# Patient Record
Sex: Male | Born: 1975 | Race: White | Hispanic: No | Marital: Married
Health system: Midwestern US, Community
[De-identification: ages and names within clinical notes are randomized; demographics above are authoritative.]

## PROBLEM LIST (undated history)

## (undated) DIAGNOSIS — Z3009 Encounter for other general counseling and advice on contraception: Secondary | ICD-10-CM

## (undated) DIAGNOSIS — M1990 Unspecified osteoarthritis, unspecified site: Secondary | ICD-10-CM

## (undated) DIAGNOSIS — K219 Gastro-esophageal reflux disease without esophagitis: Secondary | ICD-10-CM

## (undated) HISTORY — DX: Gastro-esophageal reflux disease without esophagitis: K21.9

## (undated) HISTORY — PX: NASAL POLYP EXCISION: SHX2068

## (undated) HISTORY — DX: Unspecified osteoarthritis, unspecified site: M19.90

---

## 2014-03-27 ENCOUNTER — Emergency Department (HOSPITAL_COMMUNITY): Payer: BC Managed Care – PPO

## 2014-03-27 ENCOUNTER — Encounter (HOSPITAL_COMMUNITY): Payer: Self-pay | Admitting: Emergency Medicine

## 2014-03-27 ENCOUNTER — Emergency Department (HOSPITAL_COMMUNITY)
Admission: EM | Admit: 2014-03-27 | Discharge: 2014-03-27 | Disposition: A | Discharge status: Home / Self Care | Payer: BC Managed Care – PPO | Attending: Emergency Medicine | Admitting: Emergency Medicine

## 2014-03-27 DIAGNOSIS — R079 Chest pain, unspecified: Secondary | ICD-10-CM | POA: Insufficient documentation

## 2014-03-27 DIAGNOSIS — R109 Unspecified abdominal pain: Secondary | ICD-10-CM | POA: Insufficient documentation

## 2014-03-27 LAB — COMPREHENSIVE METABOLIC PANEL
ALT: 61 U/L — ABNORMAL HIGH (ref 0–53)
AST: 40 U/L — AB (ref 0–37)
Albumin: 3.5 g/dL (ref 3.5–5.2)
Alkaline Phosphatase: 59 U/L (ref 39–117)
Anion gap: 15 (ref 5–15)
BILIRUBIN TOTAL: 0.8 mg/dL (ref 0.3–1.2)
BUN: 12 mg/dL (ref 6–23)
CO2: 22 meq/L (ref 19–32)
CREATININE: 0.7 mg/dL (ref 0.50–1.35)
Calcium: 9 mg/dL (ref 8.4–10.5)
Chloride: 105 mEq/L (ref 96–112)
GFR calc non Af Amer: >90 mL/min (ref >90)
GLUCOSE: 116 mg/dL — AB (ref 70–99)
Potassium: 3.8 mEq/L (ref 3.7–5.3)
SODIUM: 142 meq/L (ref 137–147)
Total Protein: 7.4 g/dL (ref 6.0–8.3)

## 2014-03-27 LAB — CBC WITH DIFFERENTIAL/PLATELET
BASOS ABS: 0 10*3/uL (ref 0.0–0.1)
Basophils Relative: 1 % (ref 0–1)
Eosinophils Absolute: 0.1 10*3/uL (ref 0.0–0.7)
Eosinophils Relative: 2 % (ref 0–5)
HEMATOCRIT: 46.7 % (ref 39.0–52.0)
HEMOGLOBIN: 15.8 g/dL (ref 13.0–17.0)
LYMPHS ABS: 1.8 10*3/uL (ref 0.7–4.0)
LYMPHS PCT: 30 % (ref 12–46)
MCH: 31.2 pg (ref 26.0–34.0)
MCHC: 33.8 g/dL (ref 30.0–36.0)
MCV: 92.1 fL (ref 78.0–100.0)
MONO ABS: 0.3 10*3/uL (ref 0.1–1.0)
Monocytes Relative: 6 % (ref 3–12)
NEUTROS ABS: 3.7 10*3/uL (ref 1.7–7.7)
Neutrophils Relative %: 61 % (ref 43–77)
Platelets: 205 10*3/uL (ref 150–400)
RBC: 5.07 MIL/uL (ref 4.22–5.81)
RDW: 13.6 % (ref 11.5–15.5)
WBC: 6 10*3/uL (ref 4.0–10.5)

## 2014-03-27 LAB — URINALYSIS, ROUTINE W REFLEX MICROSCOPIC
Bilirubin Urine: NEGATIVE
GLUCOSE, UA: NEGATIVE mg/dL
Hgb urine dipstick: NEGATIVE
Ketones, ur: NEGATIVE mg/dL
Leukocytes, UA: NEGATIVE
Nitrite: NEGATIVE
PH: 7.5 (ref 5.0–8.0)
Protein, ur: NEGATIVE mg/dL
Specific Gravity, Urine: 1.021 (ref 1.005–1.030)
Urobilinogen, UA: 1 mg/dL (ref 0.0–1.0)

## 2014-03-27 LAB — I-STAT TROPONIN, ED
TROPONIN I, POC: 0 ng/mL (ref 0.00–0.08)
Troponin i, poc: 0 ng/mL (ref 0.00–0.08)

## 2014-03-27 LAB — LIPASE, BLOOD: Lipase: 30 U/L (ref 11–59)

## 2014-03-27 MED ORDER — ASPIRIN 81 MG PO CHEW
324.0000 mg | CHEWABLE_TABLET | Freq: Once | ORAL | Status: AC
  Administered 2014-03-27: 324 mg via ORAL
  Filled 2014-03-27: qty 4

## 2014-03-27 MED ORDER — IOHEXOL 300 MG/ML  SOLN
80.0000 mL | Freq: Once | INTRAMUSCULAR | Status: AC | PRN
  Administered 2014-03-27: 80 mL via INTRAVENOUS

## 2014-03-27 MED ORDER — IOHEXOL 300 MG/ML  SOLN
25.0000 mL | Freq: Once | INTRAMUSCULAR | Status: AC | PRN
  Administered 2014-03-27: 25 mL via ORAL

## 2014-03-27 NOTE — ED Notes (Signed)
CT informed pt drank contrast

## 2014-03-27 NOTE — ED Notes (Signed)
Pt c/o lower abdominal pain x 1 week.  Reports pain worse yesterday; tender on palpation in LLQ. Denies urinary issues. Also complaining of L sided chest pain started "some yesterday." Reports dull and sharp pain at times. Pain radiates into neck, shoulder blades, and down L arm.

## 2014-03-27 NOTE — ED Notes (Signed)
Resident at bedside.  

## 2014-03-27 NOTE — ED Provider Notes (Signed)
I saw and evaluated the patient, reviewed the resident's note and I agree with the findings and plan.   EKG Interpretation   Date/Time:  Monday March 27 2014 08:49:32 EDT Ventricular Rate:  80 PR Interval:  154 QRS Duration: 96 QT Interval:  366 QTC Calculation: 422 R Axis:   84 Text Interpretation:  Normal sinus rhythm with sinus arrhythmia Normal ECG  Confirmed by Rosita Guzzetta,  DO, Gelene Recktenwald ([id number]) on 03/27/2014 9:16:04 AM      Pt is a 38 y.o. M with no significant past medical history who presents the emergency department with a week of lower abdominal pain, worse in the left lower quadrant without nausea, vomiting, diarrhea, dysuria or hematuria, testicular pain or swelling, bloody stools or melena. 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 He states that last night he began having some left sided chest pressure that radiates into his neck with shortness of breath. No diaphoresis or dizziness. He has no history of hypertension, diabetes, hyperlipidemia or tobacco use. No family history of CAD. No risk factors for pulmonary embolus. On exam, patient appears comfortable, hemodynamically stable. He is diffusely tender to palpation across his lower abdomen, worse in left lower quadrant. His GU exam is normal. There is no testicular pain or swelling. No scrotal masses. No penile discharge or lesions. Doubt dissection. Will obtain cardiac labs, abdominal labs, CT of his abdomen and pelvis.    Patient's labs are unremarkable including 2 troponins. He does have mild elevation of his AST and ALT but has actually no right upper quadrant tenderness or epigastric pain on exam. CT shows no acute abnormality. I feel he is safe to be discharged home with outpatient followup.  Devens, DO 03/27/14 1243

## 2014-03-27 NOTE — ED Notes (Signed)
Dr.Ward aware of patient's pain; pt attempting to use restroom at this time

## 2014-03-27 NOTE — ED Notes (Signed)
Per pt sts last night he started having some abdominal pain. sts this am began having chest pain, left arm pain, jaw pain and numbness and tingling. Denies N,V,D.

## 2014-03-27 NOTE — Discharge Instructions (Signed)

## 2014-03-27 NOTE — ED Notes (Signed)
MD at bedside. 

## 2014-03-27 NOTE — ED Provider Notes (Signed)
CSN: [id number]     Arrival date & time 03/27/14  0845 History   First MD Initiated Contact with Patient 03/27/14 0901     Chief Complaint  Patient presents with  . Chest Pain     (Consider location/radiation/quality/duration/timing/severity/associated sxs/prior Treatment) Patient is a 38 y.o. male presenting with chest pain and abdominal pain.  Chest Pain Pain location:  Substernal area Pain quality: pressure   Pain radiates to:  L arm and L jaw Pain radiates to the back: no   Pain severity:  Moderate Onset quality:  Sudden Duration:  2 days Progression:  Unchanged Chronicity:  New Associated symptoms: abdominal pain   Associated symptoms: no back pain, no cough, no fever, no headache, no nausea, no shortness of breath and not vomiting   Abdominal Pain Pain location:  RLQ and RUQ Pain quality: cramping   Pain severity:  Severe Onset quality:  Gradual Duration:  7 days Progression:  Worsening Chronicity:  New Associated symptoms: chest pain   Associated symptoms: no chills, no cough, no dysuria, no fever, no nausea, no shortness of breath, no sore throat and no vomiting     History reviewed. No pertinent past medical history. History reviewed. No pertinent past surgical history. History reviewed. No pertinent family history. History  Substance Use Topics  . Smoking status: Never Smoker   . Smokeless tobacco: Not on file  . Alcohol Use: No    Review of Systems  Constitutional: Negative for fever and chills.  HENT: Negative for sore throat.   Eyes: Negative for pain.  Respiratory: Negative for cough and shortness of breath.   Cardiovascular: Positive for chest pain.  Gastrointestinal: Positive for abdominal pain. Negative for nausea and vomiting.  Genitourinary: Negative for dysuria and flank pain.  Musculoskeletal: Negative for back pain and neck pain.  Skin: Negative for rash.  Neurological: Negative for seizures and headaches.      Allergies  Review of  patient's allergies indicates no known allergies.  Home Medications   Prior to Admission medications   Not on File   BP 113/80  Pulse 71  Temp(Src) 98.1 F (36.7 C) (Oral)  Resp 20  Ht 6' 3"  (1.905 m)  Wt 315 lb (142.883 kg)  BMI 39.37 kg/m2  SpO2 99% Physical Exam  Constitutional: He is oriented to person, place, and time. He appears well-developed and well-nourished. No distress.  HENT:  Head: Normocephalic and atraumatic.  Eyes: Pupils are equal, round, and reactive to light.  Neck: Normal range of motion.  Cardiovascular: Normal rate and regular rhythm.   Pulmonary/Chest: Effort normal and breath sounds normal.  Abdominal: Soft. He exhibits no distension. There is tenderness. There is no rigidity, no rebound, no guarding, no tenderness at McBurney's point and negative Murphy's sign.  Musculoskeletal: Normal range of motion.  Neurological: He is alert and oriented to person, place, and time.  Skin: Skin is warm. He is not diaphoretic.    ED Course  Procedures (including critical care time) Labs Review Labs Reviewed - No data to display  Imaging Review No results found.   EKG Interpretation None      MDM   Final diagnoses:  None   37 year old male with no significant past medical history presents today with abdominal pain and chest pain radiating into his left arm and left jaw. Patient also states he has had some jaw pain and numbness and tingling. Patient does deny any nausea vomiting or diarrhea.  On arrival the patient is in no acute  distress. His vital signs are stable. Patient appears uncomfortable. On exam the patient is a normal cardiopulmonary exam and minimal tenderness in the lower quadrants of the abdomen. Otherwise his abdomen is nondistended. He does not appear to have an acute abdomen at this time. Plan for this patient is to obtain some basic labs and to obtain a CT scan of his abdomen and pelvis.  Given the patient's chest pain 2 troponin levels  were checked and were both negative. Urinalysis was negative as was a CBC CMP and a lipase level. CT abdomen and pelvis with contrast demonstrated no intra-abdominal abnormalities to explain the patient's abdominal pain this time.  Patient was reassessed and had minimal improvement of his abdominal pain. I discussed with the patient at length the normal findings on his labs. Discussed importance of establishing with him following up with the primary care physician. Patient bedtime asked if he could speak with a psychiatrist on: Hospital. I asked the patient to specify what he needed to speak to a psychiatrist now. The patient stated they did not want to talk about it to me at this time. I asked the patient if he had any thoughts of wanting to harm himself or others. Patient denied any homicidal or suicidal thoughts. I discussed with the patient the possibility of setting him up with psychiatry as an outpatient. The patient was amenable to this plan. Patient at this time is comfortable with discharge and will followup with a Primary Care physician. Patient was discharged home in stable condition. The patient was seen and evaluated by myself and by the attending Dr. Glennon Hamilton, MD 03/27/14 [phone]

## 2014-04-18 ENCOUNTER — Ambulatory Visit (INDEPENDENT_AMBULATORY_CARE_PROVIDER_SITE_OTHER): Payer: BC Managed Care – PPO | Admitting: Family

## 2014-04-18 ENCOUNTER — Encounter: Payer: Self-pay | Admitting: Family

## 2014-04-18 ENCOUNTER — Ambulatory Visit: Payer: BC Managed Care – PPO | Admitting: Family

## 2014-04-18 VITALS — BP 106/74 | HR 74 | Ht 75.0 in | Wt 315.0 lb

## 2014-04-18 DIAGNOSIS — K449 Diaphragmatic hernia without obstruction or gangrene: Secondary | ICD-10-CM

## 2014-04-18 NOTE — Progress Notes (Signed)
Pre visit review using our clinic review tool, if applicable. No additional management support is needed unless otherwise documented below in the visit note. 

## 2014-04-18 NOTE — Patient Instructions (Signed)
Hiatal Hernia A hiatal hernia occurs when part of your stomach slides above the muscle that separates your abdomen from your chest (diaphragm). You can be born with a hiatal hernia (congenital), or it may develop over time. In almost all cases of hiatal hernia, only the top part of the stomach pushes through.  Many people have a hiatal hernia with no symptoms. The larger the hernia, the more likely that you will have symptoms. In some cases, a hiatal hernia allows stomach acid to flow back into the tube that carries food from your mouth to your stomach (esophagus). This may cause heartburn symptoms. Severe heartburn symptoms may mean you have developed a condition called gastroesophageal reflux disease (GERD).  CAUSES  Hiatal hernias are caused by a weakness in the opening (hiatus) where your esophagus passes through your diaphragm to attach to the upper part of your stomach. You may be born with a weakness in your hiatus, or a weakness can develop. RISK FACTORS Older age is a major risk factor for a hiatal hernia. Anything that increases pressure on your diaphragm can also increase your risk of a hiatal hernia. This includes:  Pregnancy.  Excess weight.  Frequent constipation. SIGNS AND SYMPTOMS  People with a hiatal hernia often have no symptoms. If symptoms develop, they are almost always caused by GERD. They may include:  Heartburn.  Belching.  Indigestion.  Trouble swallowing.  Coughing or wheezing.  Sore throat.  Hoarseness.  Chest pain. DIAGNOSIS  A hiatal hernia is sometimes found during an exam for another problem. Your health care provider may suspect a hiatal hernia if you have symptoms of GERD. Tests may be done to diagnose GERD. These may include:  X-rays of your stomach or chest.  An upper gastrointestinal (GI) series. This is an X-ray exam of your GI tract involving the use of a chalky liquid that you swallow. The liquid shows up clearly on the X-ray.  Endoscopy.  This is a procedure to look into your stomach using a thin, flexible tube that has a tiny camera and light on the end of it. TREATMENT  If you have no symptoms, you may not need treatment. If you have symptoms, treatment may include:  Dietary and lifestyle changes to help reduce GERD symptoms.  Medicines. These may include:  Over-the-counter antacids.  Medicines that make your stomach empty more quickly.  Medicines that block the production of stomach acid (H2 blockers).  Stronger medicines to reduce stomach acid (proton pump inhibitors).  You may need surgery to repair the hernia if other treatments are not helping. HOME CARE INSTRUCTIONS   Take all medicines as directed by your health care provider.  Quit smoking, if you smoke.  Try to achieve and maintain a healthy body weight.  Eat frequent small meals instead of three large meals a day. This keeps your stomach from getting too full.  Eat slowly.  Do not lie down right after eating.  Do noteat 1-2 hours before bed.   Do not drink beverages with caffeine. These include cola, coffee, cocoa, and tea.  Do not drink alcohol.  Avoid foods that can make symptoms of GERD worse. These may include:  Fatty foods.  Citrus fruits.  Other foods and drinks that contain acid.  Avoid putting pressure on your belly. Anything that puts pressure on your belly increases the amount of acid that may be pushed up into your esophagus.   Avoid bending over, especially after eating.  Raise the head of your bed  by putting blocks under the legs. This keeps your head and esophagus higher than your stomach.  Do not wear tight clothing around your chest or stomach.  Try not to strain when having a bowel movement, when urinating, or when lifting heavy objects. SEEK MEDICAL CARE IF:  Your symptoms are not controlled with medicines or lifestyle changes.  You are having trouble swallowing.  You have coughing or wheezing that will not  go away. SEEK IMMEDIATE MEDICAL CARE IF:  Your pain is getting worse.  Your pain spreads to your arms, neck, jaw, teeth, or back.  You have shortness of breath.  You sweat for no reason.  You feel sick to your stomach (nauseous) or vomit.  You vomit blood.  You have bright red blood in your stools.  You have black, tarry stools.  Document Released: 11/29/2003 Document Revised: 01/23/2014 Document Reviewed: 08/26/2013 [facility] Patient Information 2015 [place], [place]. This information is not intended to replace advice given to you by your health care provider. Make sure you discuss any questions you have with your health care provider.

## 2014-04-19 ENCOUNTER — Encounter: Payer: Self-pay | Admitting: Family

## 2014-04-19 NOTE — Progress Notes (Signed)
   Subjective:    Patient ID: Franklin Wade, male    DOB: 08/05/1976, 38 y.o.   MRN: [id number]  HPI 38 year old male, nonsmoker patient here to be established. Denies any concerns today. He has a history of a hiatal hernia and currently takes Prilosec 20 mg once daily to keep any symptoms under control. Recently relocated here from [place]. Reports having a complete physical with fasting labs 2 months ago.   Review of Systems  Constitutional: Negative.   HENT: Negative.   Respiratory: Negative.   Cardiovascular: Negative.   Gastrointestinal: Negative.   Endocrine: Negative.   Genitourinary: Negative.   Musculoskeletal: Negative.   Skin: Negative.   Allergic/Immunologic: Negative.   Neurological: Negative.   Psychiatric/Behavioral: Negative.    Past Medical History  Diagnosis Date  . Arthritis   . GERD (gastroesophageal reflux disease)     History   Social History  . Marital Status: Married    Spouse Name: N/A    Number of Children: N/A  . Years of Education: N/A   Occupational History  . Not on file.   Social History Main Topics  . Smoking status: Never Smoker   . Smokeless tobacco: Not on file  . Alcohol Use: No  . Drug Use: Not on file  . Sexual Activity: Not on file   Other Topics Concern  . Not on file   Social History Narrative  . No narrative on file    Past Surgical History  Procedure Laterality Date  . Nasal polyp excision      Family History  Problem Relation Age of Onset  . Hyperlipidemia Mother   . Arthritis Mother   . Hypertension Mother   . Hyperlipidemia Father   . Arthritis Father   . Hypertension Father   . Diabetes Maternal Grandfather     No Known Allergies  Current Outpatient Prescriptions on File Prior to Visit  Medication Sig Dispense Refill  . Multiple Vitamins-Minerals (MULTIVITAMIN PO) Take 1 tablet by mouth daily.       No current facility-administered medications on file prior to visit.    BP 106/74  Pulse  74  Ht 6' 3"  (1.905 m)  Wt 315 lb (142.883 kg)  BMI 39.37 kg/m2chart    Objective:   Physical Exam  Constitutional: He is oriented to person, place, and time. He appears well-developed and well-nourished.  HENT:  Right Ear: External ear normal.  Left Ear: External ear normal.  Nose: Nose normal.  Mouth/Throat: Oropharynx is clear and moist.  Neck: Normal range of motion. Neck supple.  Cardiovascular: Normal rate, regular rhythm and normal heart sounds.   Pulmonary/Chest: Effort normal and breath sounds normal.  Abdominal: Soft. Bowel sounds are normal.  Musculoskeletal: Normal range of motion.  Neurological: He is alert and oriented to person, place, and time.  Skin: Skin is warm and dry.  Psychiatric: He has a normal mood and affect.          Assessment & Plan:  Franklin Wade was seen today for establish care.  Diagnoses and associated orders for this visit:  Hiatal hernia    recheck in one year for complete physical and sooner as needed.

## 2014-04-21 ENCOUNTER — Ambulatory Visit: Payer: BC Managed Care – PPO | Admitting: Family

## 2014-09-20 ENCOUNTER — Ambulatory Visit
Admit: 2014-09-20 | Discharge: 2014-09-20 | Payer: BLUE CROSS/BLUE SHIELD | Attending: Internal Medicine | Primary: Internal Medicine

## 2014-09-20 DIAGNOSIS — K219 Gastro-esophageal reflux disease without esophagitis: Secondary | ICD-10-CM

## 2014-09-20 MED ORDER — ESOMEPRAZOLE MAGNESIUM 20 MG CAP, DELAYED RELEASE
20 mg | ORAL_CAPSULE | Freq: Every day | ORAL | Status: DC
Start: 2014-09-20 — End: 2014-10-23

## 2014-09-20 MED ORDER — PHENTERMINE 37.5 MG TAB
37.5 mg | ORAL_TABLET | ORAL | Status: DC
Start: 2014-09-20 — End: 2014-10-23

## 2014-09-20 NOTE — Progress Notes (Signed)
[name], MD    [facility] [place], [place]  [phone]        Chief Complaint   Patient presents with   ??? Ear Fullness   ??? Foot Pain     right    ??? Cough       HPI:  Nicolas Frye is here to establish care.  Recently moved to [facility] in August 2015.  Has a few issues: ear fullness, right foot pain, cough, and obesity.  Ear fullness/cough- issues improving.  -  Right foot pain- accidentally hit foot against hard object last week.  Hurt around R 1st MTP, but pain improving.    -  Obesity- has struggled with years.  Lowest weight in past was 220 pounds. Tried exercisisng and dieting on his own without results he would like to see.  Reports energy level is okay.  States he sleeps well and has never been told he snores or has breathing problems.  Tries to walk a few times a week.  -  GERD/hiatal hernia- takes otc nexium.  Will provide prescription due to costs.  Reports had EGD in the past and told he has a hiatal hernia, but no esophagitis.  I have discussed how weight loss can help symptoms.    Past Medical History   Diagnosis Date   ??? Obesity      Past Surgical History   Procedure Laterality Date   ??? Hx other surgical  2003     Nasal polyp   ??? Hx other surgical  1978     Undescended testicle repair   ??? Hx other surgical       Varicose vein L leg     Family History   Problem Relation Age of Onset   ??? Heart Disease Father 22   ??? Hypertension Father    ??? Hypertension Mother    ??? Diabetes Mother    ??? Arthritis-osteo Father    ??? Arthritis-osteo Mother      History     Social History   ??? Marital Status: MARRIED     Spouse Name: N/A     Number of Children: N/A   ??? Years of Education: N/A     Occupational History   ??? Not on file.     Social History Main Topics   ??? Smoking status: Never Smoker    ??? Smokeless tobacco: Not on file   ??? Alcohol Use: No   ??? Drug Use: Not on file   ??? Sexual Activity: Not on file     Other Topics Concern   ??? Not on file     Social History Narrative     Married.  2 sons.  Works for [employer] in [employer], political [employer].  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     No Known Allergies  Current Outpatient Prescriptions   Medication Sig Dispense Refill   ??? multivitamin (ONE A DAY) tablet Take 1 Tab by mouth daily.     ??? esomeprazole (NEXIUM) 20 mg capsule Take 1 Cap by mouth daily. Indications: HEARTBURN 90 Cap 3   ??? phentermine (ADIPEX-P) 37.5 mg tablet Take 1 Tab by mouth every morning. Max Daily Amount: 37.5 mg. Indications: WEIGHT LOSS MANAGEMENT FOR OBESE PATIENT (BMI >= 30) 30 Tab 0         Review of Systems - Negative except HPI    Visit Vitals   Item Reading   ??? BP  130/80 mmHg   ??? Pulse 95   ??? Ht 6' 2.5" (1.892 m)   ??? Wt 318 lb (144.244 kg)   ??? BMI 40.30 kg/m2       Physical Examination: General appearance - alert, well appearing, and in no distress, oriented to person, place, and time, obese  Mental status - alert, oriented to person, place, and time, normal mood, behavior, speech, dress, motor activity, and thought processes  Eyes - pupils equal and reactive, extraocular eye movements intact, sclera anicteric  Ears - bilateral TM's and external ear canals normal  Chest - clear to auscultation, no wheezes, rales or rhonchi, symmetric air entry, no tachypnea, retractions or cyanosis  Heart - normal rate, regular rhythm, normal S1, S2, no murmurs, rubs, clicks or gallops, no JVD, no leg edema  Musculoskeletal - mild of R 1st MTP with flexion and bunion noticable    Assessment and Plan    ICD-10-CM ICD-9-CM    1. Gastroesophageal reflux disease without esophagitis K21.9 530.81 esomeprazole (NEXIUM) 20 mg capsule   2. Obesity E66.9 278.00 phentermine (ADIPEX-P) 37.5 mg tablet   3. BMI 40.0-44.9, adult ([place]) Z68.41 V85.41    4. Hiatal hernia K44.9 553.3    5. Encounter for immunization Z23 V03.89 INFLUENZA VIRUS VACCINE, FLUZONE IM VIAL     Obesity biggest issue.  Discussed the patient's above normal BMI with him.   I have recommended the following interventions: dietary needs education  encourage exercise .  The BMI follow up plan is as follows: BMI is out of normal parameters and plan is as follows: I have counseled this patient on diet and exercise regimens  and started adipex to help.   30 minutes spent face to face time; >50% of time counseling on obesity and harmful effects/lifestyle improvements.      Follow-up Disposition:  Return in about 4 weeks (around 10/18/2014) for Physical; labs 1 week prior.

## 2014-10-11 ENCOUNTER — Other Ambulatory Visit: Admit: 2014-10-11 | Discharge: 2014-10-11 | Payer: BLUE CROSS/BLUE SHIELD | Primary: Internal Medicine

## 2014-10-11 DIAGNOSIS — Z0000 Encounter for general adult medical examination without abnormal findings: Secondary | ICD-10-CM

## 2014-10-11 LAB — AMB POC COMPLETE CBC,AUTOMATED ENTER
ABS. GRANS (POC): 3.1 10*3/uL (ref 1.5–6.8)
ABS. LYMPHS (POC): 1.9 10*3/uL (ref 1.2–3.2)
ABS. MONOS (POC): 0.1 10*3/uL — AB (ref 0.3–0.8)
GRANULOCYTES (POC): 58.1 % (ref 43.0–76.0)
HCT (POC): 49.5 % (ref 35–50)
HGB (POC): 16.8 g/dL — AB (ref 11–16.5)
LYMPHOCYTES (POC): 38 % (ref 17.0–48.0)
MCH (POC): 32 pg (ref 26.5–33.5)
MCHC (POC): 34 g/dL (ref 31.5–35)
MCV (POC): 94 fL (ref 80–97)
MONOCYTES (POC): 3.9 % — AB (ref 4.0–10.0)
MPV (POC): 6.7 fL (ref 6.5–11.0)
PLATELET (POC): 222 10*3/uL (ref 150–450)
RBC (POC): 5.26 10*6/uL (ref 3.8–5.8)
RDW (POC): 13.6 % (ref 10.0–15.0)
WBC (POC): 5.1 10*3/uL (ref 3.5–10)

## 2014-10-12 LAB — METABOLIC PANEL, COMPREHENSIVE
A-G Ratio: 1.3 (ref 1.1–2.5)
ALT (SGPT): 72 IU/L — ABNORMAL HIGH (ref 0–44)
AST (SGOT): 53 IU/L — ABNORMAL HIGH (ref 0–40)
Albumin: 4.4 g/dL (ref 3.5–5.5)
Alk. phosphatase: 65 IU/L (ref 39–117)
BUN/Creatinine ratio: 11 (ref 9–20)
BUN: 10 mg/dL (ref 6–24)
Bilirubin, total: 1.3 mg/dL — ABNORMAL HIGH (ref 0.0–1.2)
CO2: 24 mmol/L (ref 18–29)
Calcium: 9.6 mg/dL (ref 8.7–10.2)
Chloride: 100 mmol/L (ref 97–108)
Creatinine: 0.9 mg/dL (ref 0.76–1.27)
GFR est AA: 123 mL/min/{1.73_m2} (ref >59)
GFR est non-AA: 106 mL/min/{1.73_m2} (ref >59)
GLOBULIN, TOTAL: 3.4 g/dL (ref 1.5–4.5)
Glucose: 98 mg/dL (ref 65–99)
Potassium: 4.6 mmol/L (ref 3.5–5.2)
Protein, total: 7.8 g/dL (ref 6.0–8.5)
Sodium: 140 mmol/L (ref 134–144)

## 2014-10-12 LAB — T4, FREE: T4, Free: 1.09 ng/dL (ref 0.82–1.77)

## 2014-10-12 LAB — LIPID PANEL
Cholesterol, total: 148 mg/dL (ref 100–199)
HDL Cholesterol: 50 mg/dL (ref >39)
LDL, calculated: 85 mg/dL (ref 0–99)
Triglyceride: 67 mg/dL (ref 0–149)
VLDL, calculated: 13 mg/dL (ref 5–40)

## 2014-10-12 LAB — MICROSCOPIC EXAMINATION
Casts: NONE SEEN /lpf
Epithelial cells: NONE SEEN /hpf (ref 0–10)

## 2014-10-12 LAB — URINALYSIS W/MICROSCOPIC
Bilirubin: NEGATIVE
Blood: NEGATIVE
Glucose: NEGATIVE
Leukocyte Esterase: NEGATIVE
Nitrites: NEGATIVE
Specific Gravity: 1.025 (ref 1.005–1.030)
Urobilinogen: 1 mg/dL (ref 0.2–1.0)
pH (UA): 7.5 (ref 5.0–7.5)

## 2014-10-12 LAB — TSH 3RD GENERATION: TSH: 1.51 u[IU]/mL (ref 0.450–4.500)

## 2014-10-23 ENCOUNTER — Ambulatory Visit
Admit: 2014-10-23 | Discharge: 2014-10-23 | Payer: BLUE CROSS/BLUE SHIELD | Attending: Internal Medicine | Primary: Internal Medicine

## 2014-10-23 DIAGNOSIS — Z0000 Encounter for general adult medical examination without abnormal findings: Secondary | ICD-10-CM

## 2014-10-23 MED ORDER — PHENTERMINE 37.5 MG TAB
37.5 mg | ORAL_TABLET | ORAL | Status: DC
Start: 2014-10-23 — End: 2014-11-23

## 2014-10-23 NOTE — Progress Notes (Signed)
[name], MD    [place], [place]  [phone]        Chief Complaint   Patient presents with   ??? Complete Physical       HPI:  Nicolas Frye is here for his physical/lab review and weight recheck.  Labs look good for most part.  Liver enzymes slightly elevated and hemoglobin slightly elevated.  Will monitor these.  Due to slight elevation of hemoglobin, we discussed sleep apnea signs/symptoms.  He does report some daytime fatigue and restless sleep.  Denies history of snoring.  Reports sleep study 10 years ago that was normal.  Offered at home sleep study referral, but he wants to wait and see if sleep improves with continued weight loss.  I have encouraged him to try to sleep on his side.  Obesity- has struggled with years.  Lowest weight in past was 220 pounds. Tried exercising and dieting on his own without results he would like to see.  Reports energy level is okay.  States he sleeps well and has never been told he snores or has breathing problems.  Tries to walk a few times a week.  10/23/14- has lost 18 pounds since last visit (over last 4 weeks).  -  GERD/hiatal hernia- takes otc nexium.  Will provide prescription due to costs.  Reports had EGD in the past and told he has a hiatal hernia, but no esophagitis.  I have discussed how weight loss can help symptoms.    Past Medical History   Diagnosis Date   ??? Obesity      Past Surgical History   Procedure Laterality Date   ??? Hx other surgical  2003     Nasal polyp   ??? Hx other surgical  1978     Undescended testicle repair   ??? Hx other surgical       Varicose vein L leg     Family History   Problem Relation Age of Onset   ??? Heart Disease Father 60   ??? Hypertension Father    ??? Hypertension Mother    ??? Diabetes Mother    ??? Arthritis-osteo Father    ??? Arthritis-osteo Mother      History     Social History   ??? Marital Status: MARRIED     Spouse Name: N/A     Number of Children: N/A   ??? Years of Education: N/A      Occupational History   ??? Not on file.     Social History Main Topics   ??? Smoking status: Never Smoker    ??? Smokeless tobacco: Not on file   ??? Alcohol Use: No   ??? Drug Use: Not on file   ??? Sexual Activity: Not on file     Other Topics Concern   ??? Not on file     Social History Narrative    Married.  2 sons.  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     No Known Allergies  Current Outpatient Prescriptions   Medication Sig Dispense Refill   ??? OMEPRAZOLE PO Take  by mouth. OTC medication     ??? phentermine (ADIPEX-P) 37.5 mg tablet Take 1 Tab by mouth every morning. Max Daily Amount: 37.5 mg. Indications: WEIGHT LOSS MANAGEMENT FOR OBESE PATIENT (BMI >= 30) 30 Tab 0   ??? multivitamin (ONE A DAY) tablet Take 1 Tab  by mouth daily.           Review of Systems - Negative except HPI    Visit Vitals   Item Reading   ??? BP 120/80 mmHg   ??? Pulse 70   ??? Ht 6' 2.5" (1.892 m)   ??? Wt 300 lb 12.8 oz (136.442 kg)   ??? BMI 38.12 kg/m2       Physical Examination: General appearance - alert, well appearing, and in no distress and oriented to person, place, and time, obese  Mental status - alert, oriented to person, place, and time, normal mood, behavior, speech, dress, motor activity, and thought processes  Eyes - pupils equal and reactive, extraocular eye movements intact, sclera anicteric  Ears - bilateral TM's and external ear canals normal  Nose - normal and patent, no erythema, discharge or polyps  Mouth - mucous membranes moist, pharynx normal without lesions, tonsils normal  Neck - supple, no significant adenopathy, carotids upstroke normal bilaterally, no bruits, thyroid exam: thyroid is normal in size without nodules or tenderness  Lymphatics - no palpable lymphadenopathy in neck, axilla, or groin  Chest - clear to auscultation, no wheezes, rales or rhonchi, symmetric air entry, no tachypnea, retractions or cyanosis   Heart - normal rate, regular rhythm, normal S1, S2, no murmurs, rubs, clicks or gallops, no JVD, no leg edema  Abdomen - soft, nontender, nondistended, no masses or organomegaly, visceral obesity noted  Neurological - alert, oriented, normal speech, no focal findings or movement disorder noted  Musculoskeletal - no joint tenderness, deformity or swelling, normal station and gait  Skin - normal coloration and turgor, no rashes, no suspicious skin lesions noted      Results for orders placed or performed in visit on 10/11/14   LIPID PANEL   Result Value Ref Range    Cholesterol, total 148 100 - 199 mg/dL    Triglyceride 67 0 - 149 mg/dL    HDL Cholesterol 50 >39 mg/dL    VLDL, calculated 13 5 - 40 mg/dL    LDL, calculated 85 0 - 99 mg/dL   METABOLIC PANEL, COMPREHENSIVE   Result Value Ref Range    Glucose 98 65 - 99 mg/dL    BUN 10 6 - 24 mg/dL    Creatinine 0.90 0.76 - 1.27 mg/dL    GFR est non-AA 106 >59 mL/min/1.73    GFR est AA 123 >59 mL/min/1.73    BUN/Creatinine ratio 11 9 - 20    Sodium 140 134 - 144 mmol/L    Potassium 4.6 3.5 - 5.2 mmol/L    Chloride 100 97 - 108 mmol/L    CO2 24 18 - 29 mmol/L    Calcium 9.6 8.7 - 10.2 mg/dL    Protein, total 7.8 6.0 - 8.5 g/dL    Albumin 4.4 3.5 - 5.5 g/dL    GLOBULIN, TOTAL 3.4 1.5 - 4.5 g/dL    A-G Ratio 1.3 1.1 - 2.5    Bilirubin, total 1.3 (H) 0.0 - 1.2 mg/dL    Alk. phosphatase 65 39 - 117 IU/L    AST 53 (H) 0 - 40 IU/L    ALT 72 (H) 0 - 44 IU/L   T4, FREE   Result Value Ref Range    T4, Free 1.09 0.82 - 1.77 ng/dL   TSH, 3RD GENERATION   Result Value Ref Range    TSH 1.510 0.450 - 4.500 uIU/mL   URINALYSIS W/MICROSCOPIC   Result Value Ref Range  Specific Gravity 1.025 1.005 - 1.030    pH (UA) 7.5 5.0 - 7.5    Color Yellow Yellow    Appearance Cloudy (A) Clear    Leukocyte Esterase Negative Negative    Protein 1+ (A) Negative/Trace    Glucose Negative Negative    Ketone Trace (A) Negative    Blood Negative Negative    Bilirubin Negative Negative     Urobilinogen 1.0 0.2 - 1.0 mg/dL    Nitrites Negative Negative    Microscopic Examination See additional order    MICROSCOPIC EXAMINATION   Result Value Ref Range    WBC 0-5 0 -  5 /hpf    RBC 0-2 0 -  2 /hpf    Epithelial cells None seen 0 - 10 /hpf    Casts None seen None seen /lpf    Mucus Present Not Estab.    Bacteria Few None seen/Few   AMB POC COMPLETE CBC,AUTOMATED ENTER   Result Value Ref Range    WBC (POC) 5.1 3.5 - 10 10^3/ul    RBC (POC) 5.26 3.8 - 5.8 10^6/ul    HGB (POC) 16.8 (A) 11 - 16.5 g/dL    HCT (POC) 49.5 35 - 50 %    MCV (POC) 94 80 - 97 fL    MCH (POC) 32.0 26.5 - 33.5 pg    MCHC (POC) 34.0 31.5 - 35 g/dL    RDW (POC) 13.6 10.0 - 15.0 %    PLATELET (POC) 222 150 - 450 10^3/ul    MPV (POC) 6.7 6.5 - 11.0 fL    LYMPHOCYTES (POC) 38.0 17.0 - 48.0 %    ABS. LYMPHS (POC) 1.9 1.2 - 3.2 10^3/ul    MONOCYTES (POC) 3.9 (A) 4.0 - 10.0 %    ABS. MONOS (POC) 0.1 (A) 0.3 - 0.8 10^3/ul    GRANULOCYTES (POC) 58.1 43.0 - 76.0 %    ABS. GRANS (POC) 3.1 1.5 - 6.8 10^3/ul       Assessment and Plan    ICD-10-CM ICD-9-CM    1. Physical exam, annual Z00.00 V70.0    2. Obesity (BMI 35.0-39.9 without comorbidity) E66.9 278.00 phentermine (ADIPEX-P) 37.5 mg tablet   3. Fatigue R53.83 780.79    4. Transaminitis R74.0 790.4 Very mild.  Likely due to fatty liver.  Will recheck in 6 months or so to make sure not worsening.  No further work up needed at this time.     Discussed the patient's above normal BMI with him.  I have recommended the following interventions: encourage exercise  lifestyle education regarding diet .  The BMI follow up plan is as follows: BMI is out of normal parameters and plan is as follows: Recommended commercial weight management program and medications:  1800 Calorie Diet and phentermine (Adipex).        Follow-up Disposition:  Return in about 1 month (around 11/21/2014) for adipex refill and weight check.

## 2014-11-23 ENCOUNTER — Ambulatory Visit
Admit: 2014-11-23 | Discharge: 2014-11-23 | Payer: BLUE CROSS/BLUE SHIELD | Attending: Internal Medicine | Primary: Internal Medicine

## 2014-11-23 DIAGNOSIS — E669 Obesity, unspecified: Secondary | ICD-10-CM

## 2014-11-23 MED ORDER — PHENTERMINE 37.5 MG TAB
37.5 mg | ORAL_TABLET | ORAL | Status: DC
Start: 2014-11-23 — End: 2015-05-09

## 2014-11-23 NOTE — Progress Notes (Signed)
[name], MD    [place], [place]  [phone]        Chief Complaint   Patient presents with   ??? Follow-up     1 month       HPI:  Nicolas Frye is here for for weight recheck.    Obesity- has struggled with years.  Lowest weight in past was 220 pounds. Tried exercising and dieting on his own without results he would like to see.  Reports energy level is okay.  States he sleeps well and has never been told he snores or has breathing problems.  Tries to walk a few times a week.  10/23/14- has lost 18 pounds since last visit (over last 4 weeks).  11/23/14- has lost at least another 3 pounds.  Has travelled over the last few weeks for work and ate out more than usual.  States phentermine actually causing a little sleep issue and has not been taking over the last week.  Wants to try another week without it to see how he does with his weight loss.  Will provide prescription in case he decides to resume the adipex.    -  Cold virus- started a few days ago.  Has congestion, drainage, and cough.  Denies fevers or feeling poorly otherwise.  Has been using Dayquil/Nyquil with relief.    Past Medical History   Diagnosis Date   ??? Obesity      Past Surgical History   Procedure Laterality Date   ??? Hx other surgical  2003     Nasal polyp   ??? Hx other surgical  1978     Undescended testicle repair   ??? Hx other surgical       Varicose vein L leg     Family History   Problem Relation Age of Onset   ??? Heart Disease Father 37   ??? Hypertension Father    ??? Hypertension Mother    ??? Diabetes Mother    ??? Arthritis-osteo Father    ??? Arthritis-osteo Mother      History     Social History   ??? Marital Status: MARRIED     Spouse Name: N/A   ??? Number of Children: N/A   ??? Years of Education: N/A     Occupational History   ??? Not on file.     Social History Main Topics   ??? Smoking status: Never Smoker    ??? Smokeless tobacco: Not on file   ??? Alcohol Use: No   ??? Drug Use: Not on file    ??? Sexual Activity: Not on file     Other Topics Concern   ??? Not on file     Social History Narrative    Married.  2 sons.  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     No Known Allergies  Current Outpatient Prescriptions   Medication Sig Dispense Refill   ??? phentermine (ADIPEX-P) 37.5 mg tablet Take 1 Tab by mouth every morning. Max Daily Amount: 37.5 mg. Indications: WEIGHT LOSS MANAGEMENT FOR OBESE PATIENT (BMI >= 30) 30 Tab 0   ??? OMEPRAZOLE PO Take  by mouth. OTC medication     ??? multivitamin (ONE A DAY) tablet Take 1 Tab by mouth daily.           Review of Systems - Negative except HPI    Visit Vitals  Item Reading   ??? BP 136/94 mmHg   ??? Pulse 93   ??? Ht 6' 2.5" (1.892 m)   ??? Wt 297 lb (134.718 kg)   ??? BMI 37.63 kg/m2       Physical Examination: General appearance - alert, well appearing, and in no distress, oriented to person, place, and time, obese  Mental status - alert, oriented to person, place, and time, normal mood, behavior, speech, dress, motor activity, and thought processes  Musculoskeletal - normal station and gait    Assessment and Plan    ICD-10-CM ICD-9-CM    1. Obesity (BMI 35.0-39.9 without comorbidity) ([place]) E66.01 278.01 phentermine (ADIPEX-P) 37.5 mg tablet   2. Weight loss counseling, encounter for Z71.3 V65.3 phentermine (ADIPEX-P) 37.5 mg tablet   3. Cold virus J00 460 Continue symptomatic treatment.   4. Elevated blood pressure I10 401.9 Has not been high in the past.  Has been taking Dayquil and Nyquil for a cold.  Will recheck next visit.  May need to start monitoring at home.       Follow-up Disposition:  Return in about 6 weeks (around 01/04/2015) for Weight check.

## 2015-01-05 ENCOUNTER — Encounter: Payer: BLUE CROSS/BLUE SHIELD | Attending: Internal Medicine | Primary: Internal Medicine

## 2015-01-08 IMAGING — CT CT ABD-PELV W/ CM
2 of 4 series · 16 of 46 positions shown, 18 images · IV contrast (omnipaque)
Comparison: None.

CLINICAL DATA: One week history of lower abdominal/LLQ pain

EXAM:
CT ABDOMEN AND PELVIS WITH CONTRAST
TECHNIQUE: Multidetector CT imaging of the abdomen and pelvis was performed
using the standard protocol following bolus administration of
intravenous contrast.
CONTRAST:  80mL OMNIPAQUE IOHEXOL 300 MG/ML SOLN intravenously ; the
patient also received oral contrast material

[Series 2: abd/ pelvis 5.0 i30f 1 · axial · 0.81mm/px · z∈[-451,+9]mm · 13 of 100 slices shown, 15 images]
[im 4/100  soft-tissue]
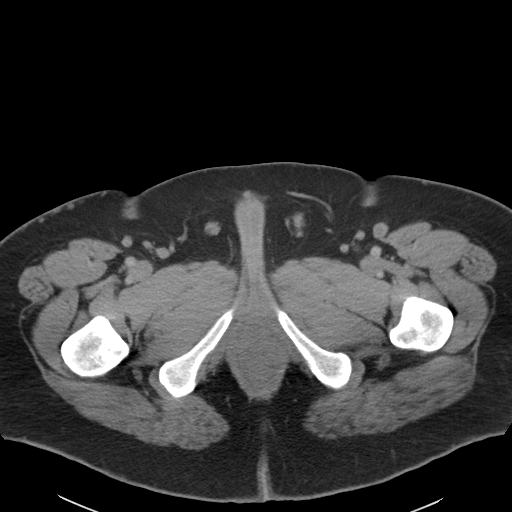
[im 4/100  bone]
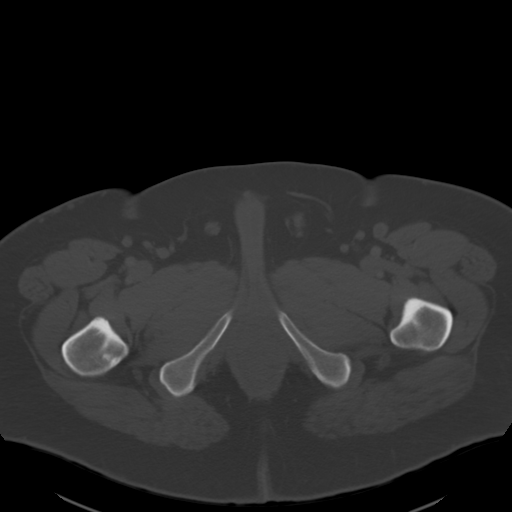
[im 12/100  soft-tissue]
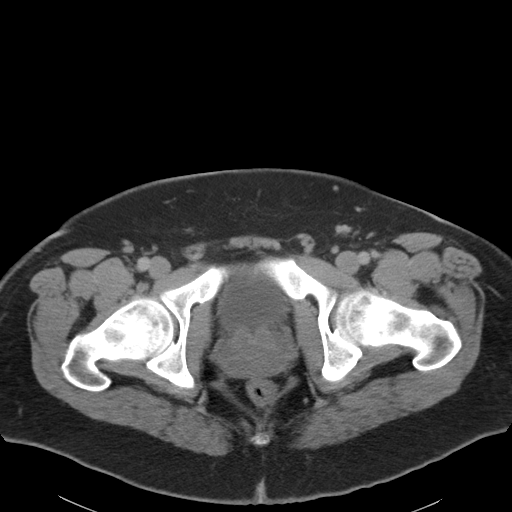
[im 20/100  soft-tissue]
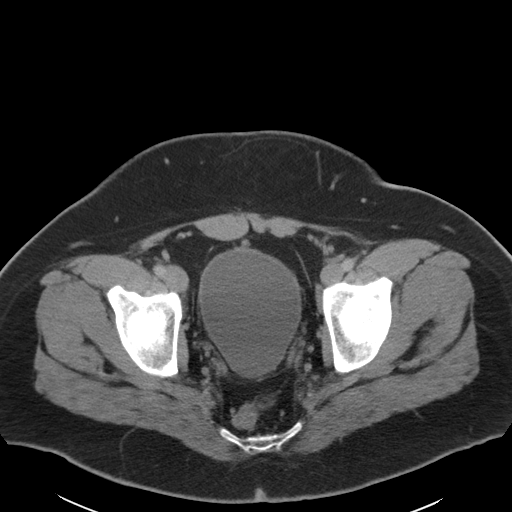
[im 28/100  soft-tissue]
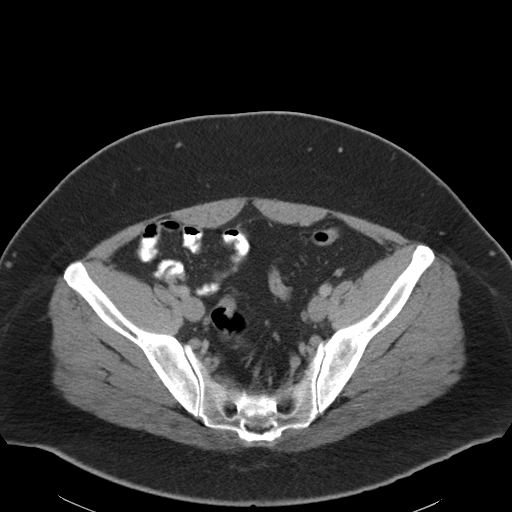
[im 36/100  soft-tissue]
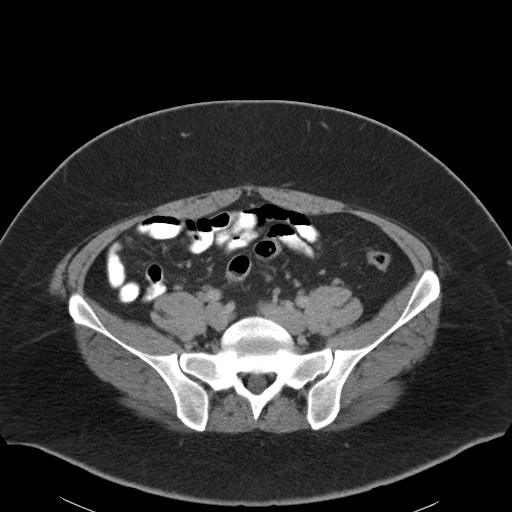
[im 44/100  soft-tissue]
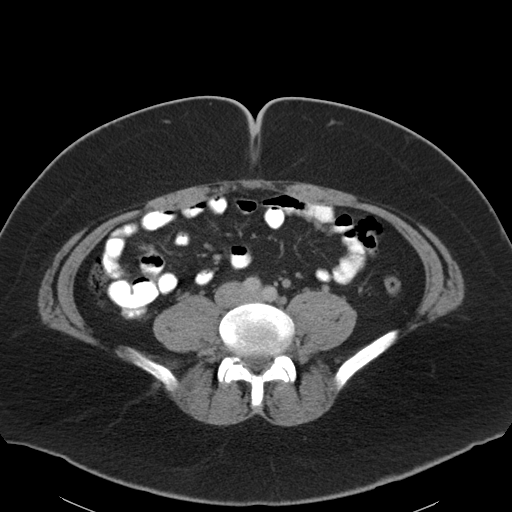
[im 52/100  soft-tissue]
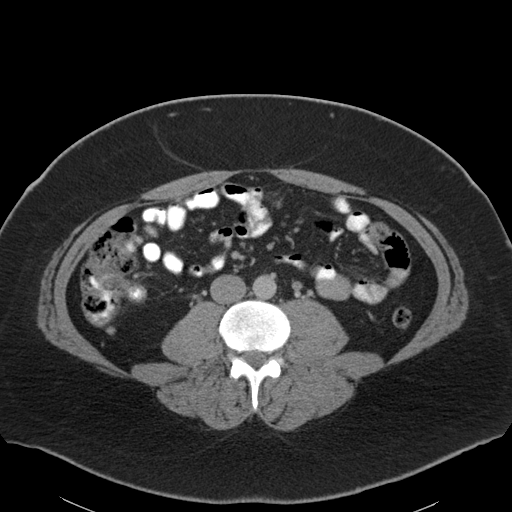
[im 56/100  soft-tissue]
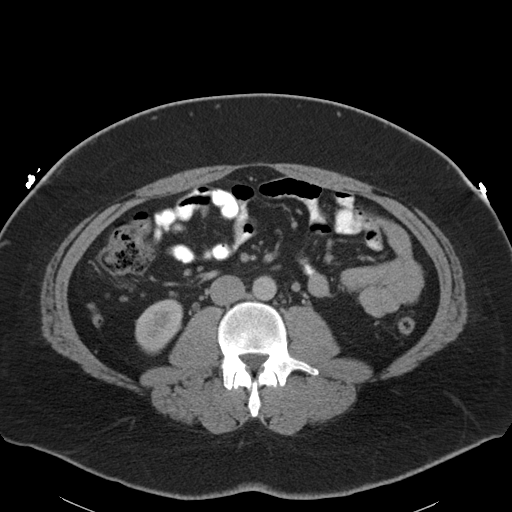
[im 64/100  soft-tissue]
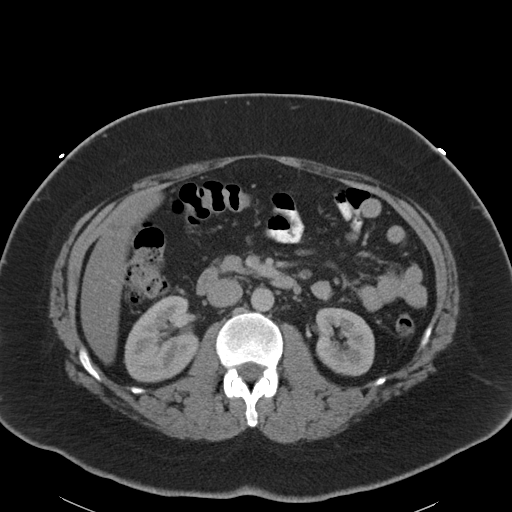
[im 64/100  bone]
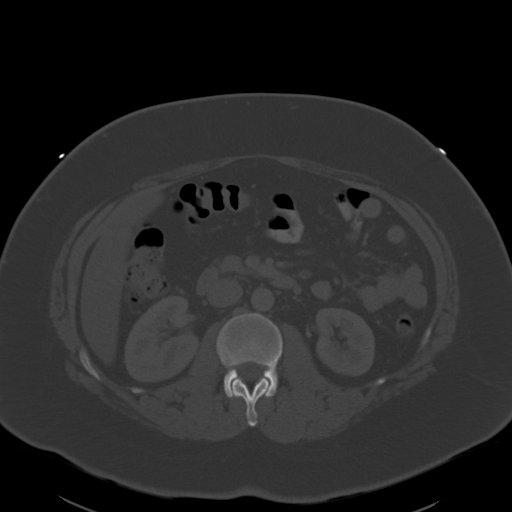
[im 72/100  soft-tissue]
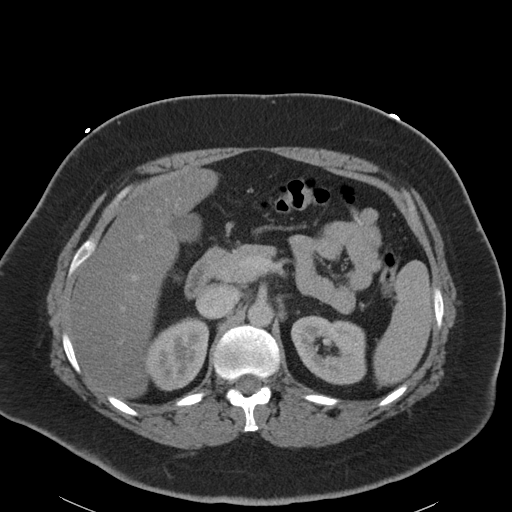
[im 80/100  soft-tissue]
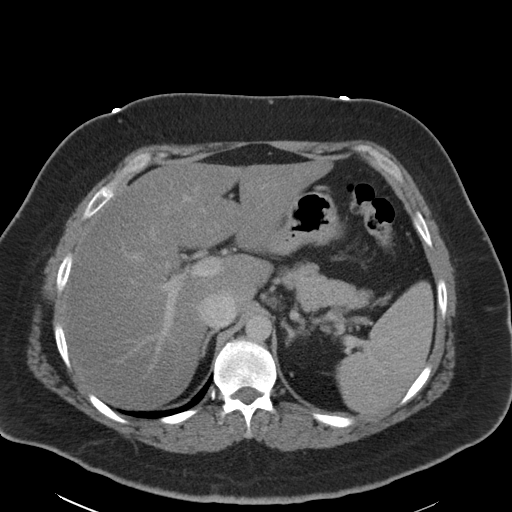
[im 88/100  soft-tissue]
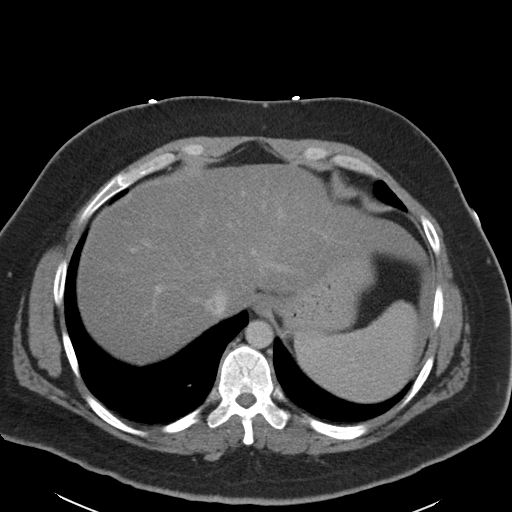
[im 96/100  soft-tissue]
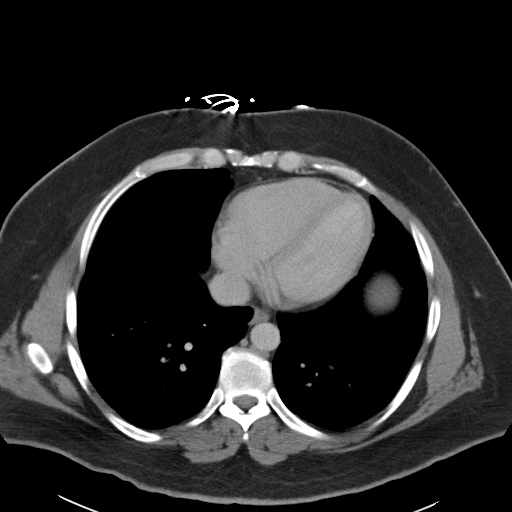

[Series 5: coronals · coronal · 0.88mm/px · 3 of 124 slices shown]
[im 42/124  soft-tissue]
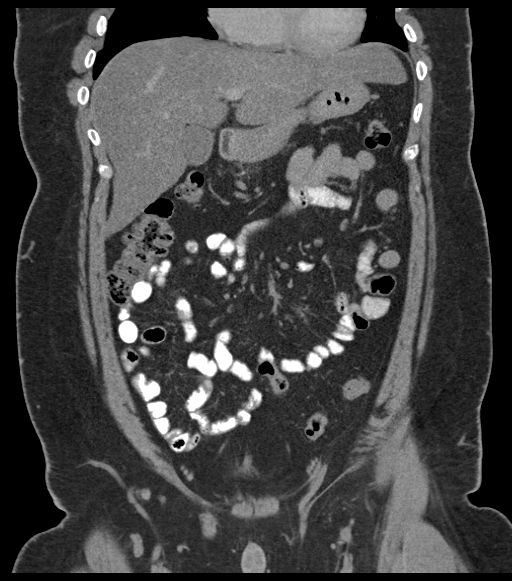
[im 55/124  soft-tissue]
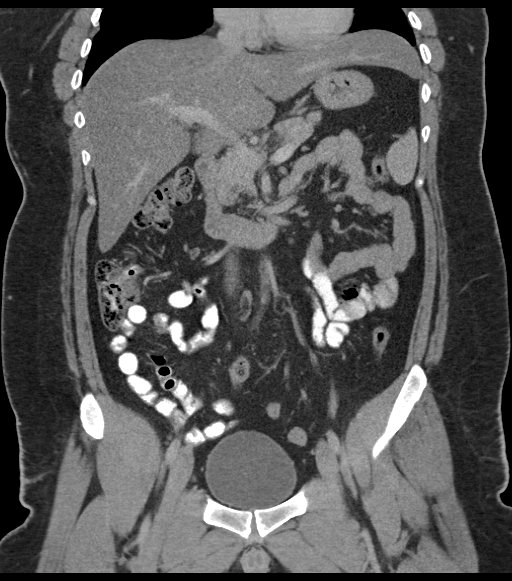
[im 69/124  soft-tissue]
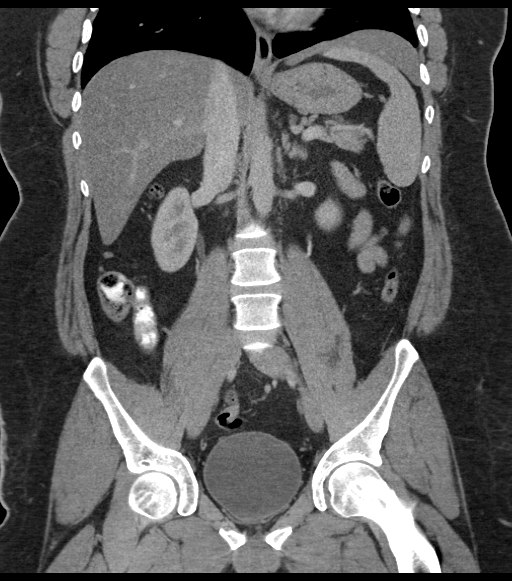

[16 of 46 positions shown; findings below may reference images not displayed]

FINDINGS: The liver exhibits decreased density diffusely consistent with fatty
infiltration. There is heterogeneous density of the portal vein and
superior mesenteric vein consistent with admixing of non contrasted
and contrasted blood. The gallbladder, spleen, partially distended
stomach, pancreas, adrenal glands, kidneys, and abdominal aorta are
normal. The periaortic and pericaval regions are normal.

The oral contrast has traversed the small bowel and reached the
right colon. There is no small bowel obstruction or ileus or
evidence of enteritis. A normal appendix is demonstrated. The colon
exhibits no evidence of colitis or diverticulitis. There is no
ascites.

The urinary bladder is moderately distended and grossly normal. The
prostate gland produces a mild impression upon the urinary bladder
base. There is no inguinal nor umbilical hernia.

The lumbar spine and bony pelvis are unremarkable. The lung bases
exhibit minimal compressive atelectasis.
IMPRESSION: 1. There is no evidence of colitis or diverticulitis nor other bowel
abnormality.
2. The hepatobiliary tree and urinary tracts are normal.
3. No intra-abdominal mass or lymphadenopathy is demonstrated.

## 2015-01-08 IMAGING — CR DG CHEST 2V
2 series · 2 of 2 positions shown · non-contrast
Comparison: None

CLINICAL DATA: Chest pain.

EXAM:
CHEST - 2 VIEW

[w chest pa]
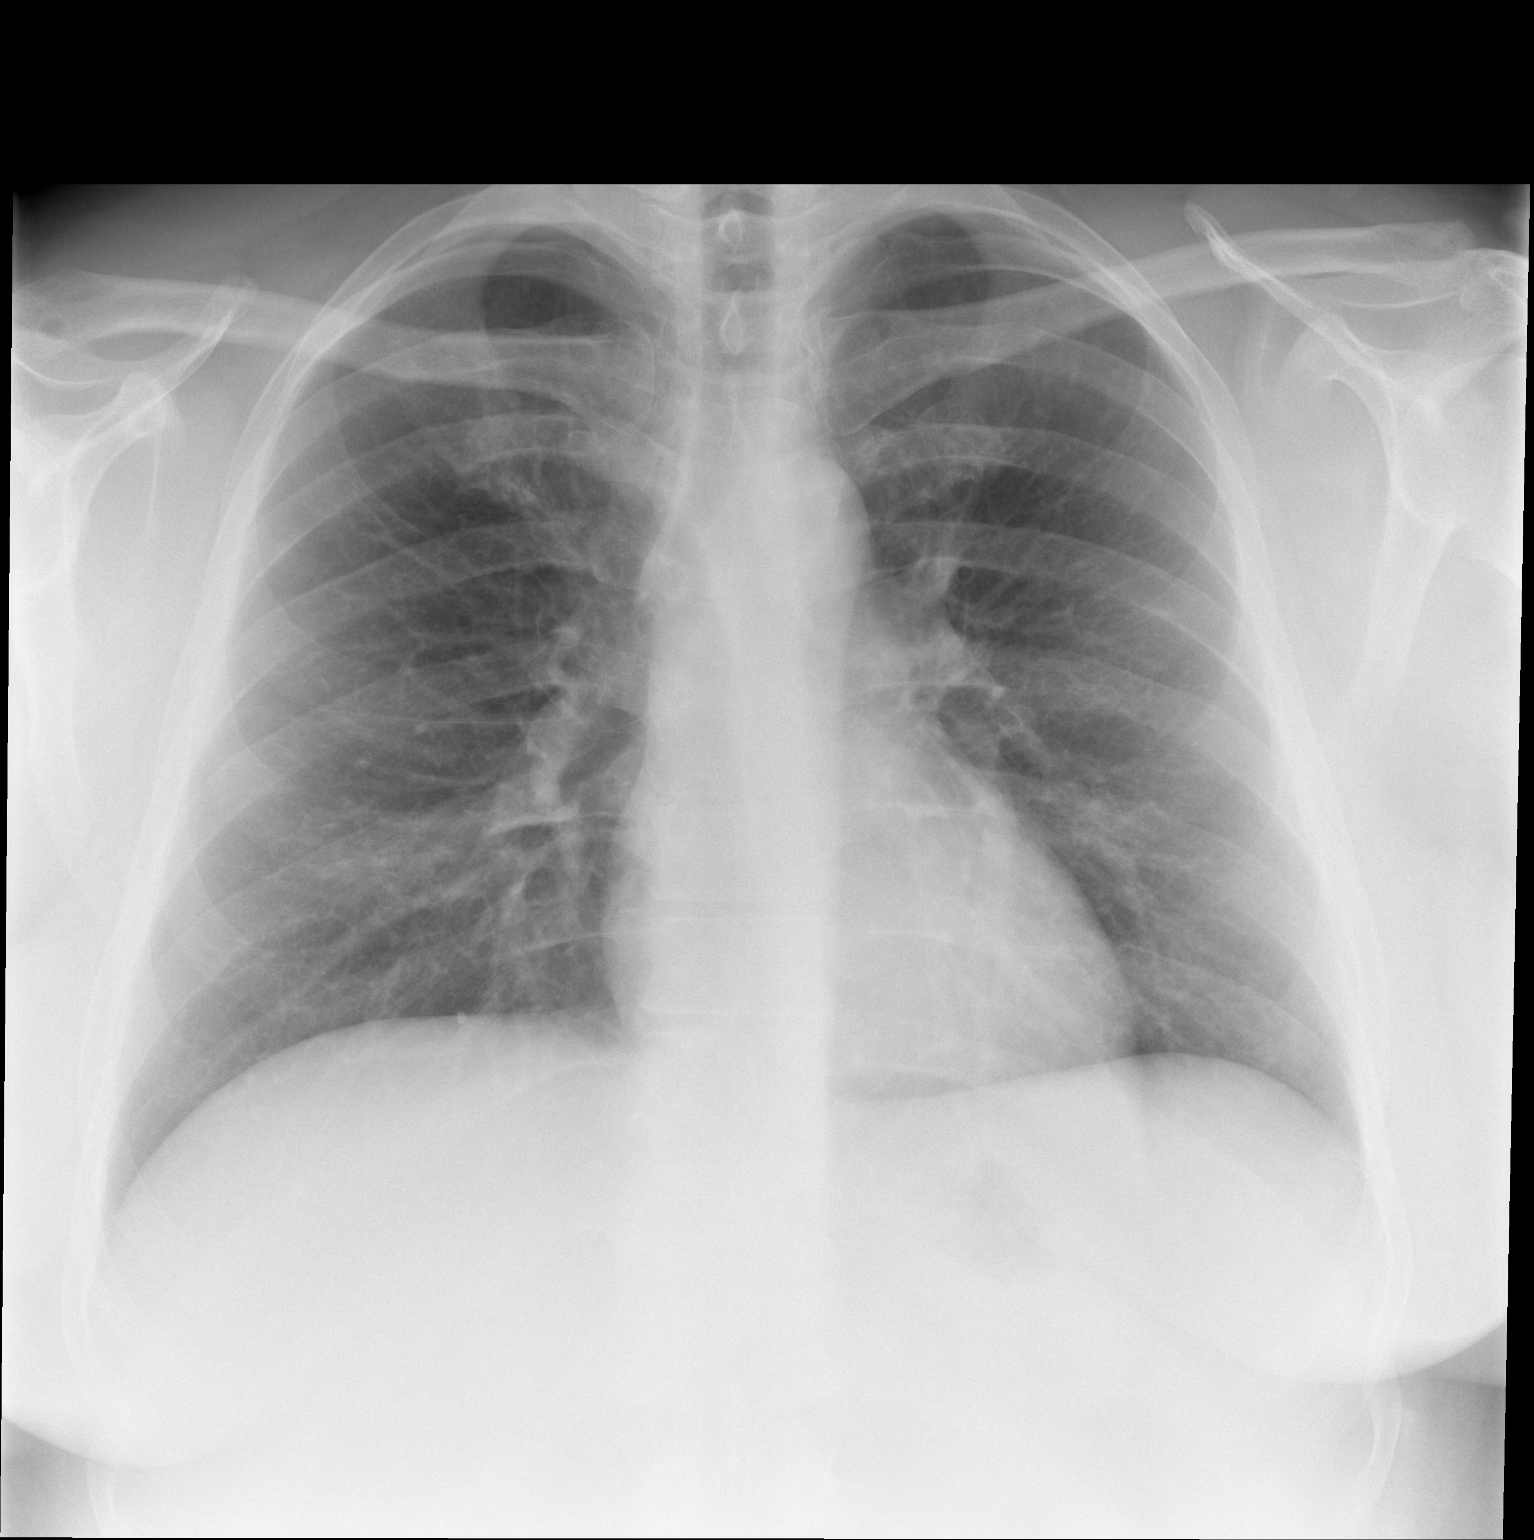

[w chest lat]
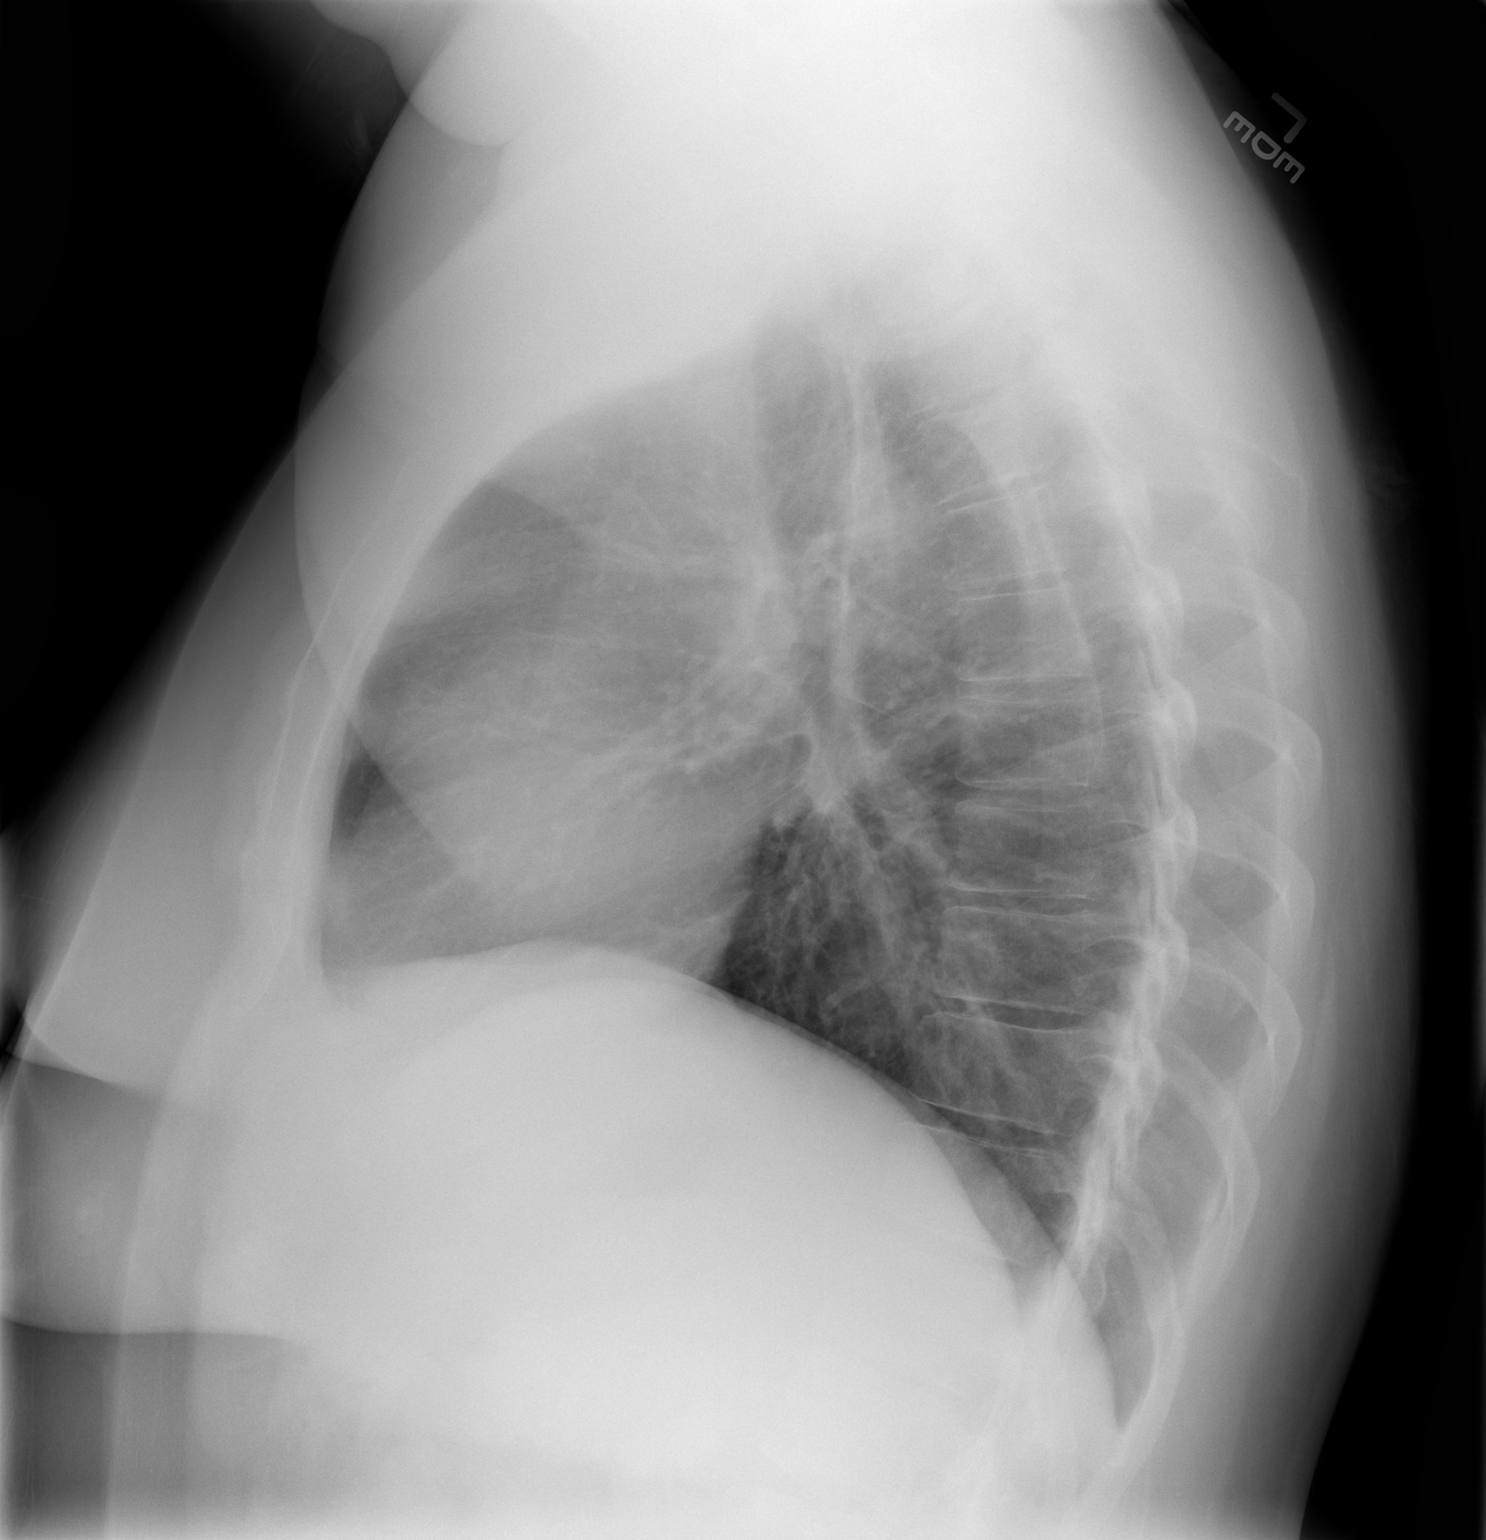

[2 of 2 positions shown; findings below may reference images not displayed]

FINDINGS: The heart size and mediastinal contours are within normal limits.
There is no evidence of pulmonary edema, consolidation,
pneumothorax, nodule or pleural fluid. The visualized skeletal
structures are unremarkable.
IMPRESSION: No active disease.

## 2015-03-15 ENCOUNTER — Telehealth

## 2015-03-15 NOTE — Telephone Encounter (Signed)
Pt called wanting a referral to a Urologist states he wants to get a Vasectomy. Pt is wanting to know if he needs to see Dr.Smith to get the referral or not.

## 2015-03-22 NOTE — Telephone Encounter (Signed)
As allowed per HIPPA, left message on Pt voicemail urology referral has been placed.

## 2015-03-22 NOTE — Telephone Encounter (Signed)
Urology referral placed.

## 2015-04-23 ENCOUNTER — Encounter: Payer: BLUE CROSS/BLUE SHIELD | Attending: Urology | Primary: Internal Medicine

## 2015-05-09 ENCOUNTER — Ambulatory Visit
Admit: 2015-05-09 | Discharge: 2015-05-09 | Payer: BLUE CROSS/BLUE SHIELD | Attending: Urology | Primary: Internal Medicine

## 2015-05-09 DIAGNOSIS — Z3009 Encounter for other general counseling and advice on contraception: Secondary | ICD-10-CM

## 2015-05-09 NOTE — Progress Notes (Signed)
[facility]  [address] [place] [address]  [phone]    Nicolas Frye  DOB: [birth year]    Chief Complaint   Patient presents with   ??? Vasectomy     Good morning       HPI     Nicolas Frye is a 39 y.o. male          Past Medical History   Diagnosis Date   ??? Obesity      Past Surgical History   Procedure Laterality Date   ??? Hx other surgical  2003     Nasal polyp   ??? Hx other surgical  1978     Undescended testicle repair   ??? Hx other surgical       Varicose vein L leg     Current Outpatient Prescriptions   Medication Sig Dispense Refill   ??? OMEPRAZOLE PO Take  by mouth. OTC medication     ??? multivitamin (ONE A DAY) tablet Take 1 Tab by mouth daily.       No Known Allergies  Social History     Social History   ??? Marital status: MARRIED     Spouse name: N/A   ??? Number of children: N/A   ??? Years of education: N/A     Occupational History   ??? Not on file.     Social History Main Topics   ??? Smoking status: Never Smoker   ??? Smokeless tobacco: Not on file   ??? Alcohol use 0.0 oz/week     0 Standard drinks or equivalent per week   ??? Drug use: Not on file   ??? Sexual activity: Not on file     Other Topics Concern   ??? Not on file     Social History Narrative    Married.  2 sons.  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     Family History   Problem Relation Age of Onset   ??? Hypertension Mother    ??? Diabetes Mother    ??? Arthritis-osteo Mother    ??? Heart Disease Father 65   ??? Hypertension Father    ??? Arthritis-osteo Father        Review of Systems  Constitutional: Negative  Skin: Negative skin ROS  Eyes: Eyes negative  ENT: HENT negative  Respiratory: Respiratory negative  Cardiovascular: Positive for varicose veins.  GI: Positive for abdominal pain, indigestion and heartburn.  Genitourinary: Genitourinary negative  Musculoskeletal: Positive for arthralgias.  Neurological: Neg neuro ROS  Psychological: Neg psych ROS   Endocrine: Endocrine negative  Hem/Lymphatic: Hematologic/lymphatic negative        Physical Exam      Assessment and Plan    ICD-10-CM ICD-9-CM    1. Vasectomy evaluation Z30.09 V25.09        No orders of the defined types were placed in this encounter.      Follow-up Disposition:  Return if symptoms worsen or fail to improve, for vasectomy F/U.  Vasectomy Complications Consultation    1. The purpose of a vasectomy is to render me permanently sterile (unable to make a woman pregnant).  2. There is no guarantee that sterility will be obtained (sterility is either not obtained or fertility returns in approximately 1 in 500 cases).  3. I must continue to use contraceptives until sterility is confirmed by two negative sperm counts in this office. I will need to  bring a semen sample after 83-month and 1-week later and that a sperm count will be performed on it.   4. Because there is a supply of sperm in the reservoir beyond the vasectomy site, I will remain fertile (able to make a woman pregnant) after the procedure until the reservoir is empty.  5. Possible complications include, but are not limited to:    ?? Pain  ?? Swelling  ?? Bleeding and bruising  ?? Infection  ?? Possible psychological effect on my sex life  ?? Sperm granuloma (a reaction to the sperm in the scrotum)  ?? Vas deferens may grow back together in the first few months or later  ?? Allergy or reaction to the local anesthetic  ?? Failure to achieve permanent sterilization  ??    Elements of this note have been dictated using speech recognition software.  Although reviewed, errors of speech recognition may have occurred.

## 2015-06-01 ENCOUNTER — Encounter: Payer: BLUE CROSS/BLUE SHIELD | Attending: Urology | Primary: Internal Medicine

## 2015-06-01 NOTE — Telephone Encounter (Signed)
NOTED

## 2015-06-01 NOTE — Telephone Encounter (Signed)
Patient was a no show for today appointment. Please review

## 2015-06-15 ENCOUNTER — Ambulatory Visit
Admit: 2015-06-15 | Discharge: 2015-06-15 | Payer: BLUE CROSS/BLUE SHIELD | Attending: Urology | Primary: Internal Medicine

## 2015-06-15 ENCOUNTER — Encounter: Attending: Urology | Primary: Internal Medicine

## 2015-06-15 DIAGNOSIS — Z302 Encounter for sterilization: Secondary | ICD-10-CM

## 2015-06-15 MED ORDER — CIPROFLOXACIN 500 MG TAB
500 mg | ORAL_TABLET | Freq: Two times a day (BID) | ORAL | 0 refills | Status: AC
Start: 2015-06-15 — End: 2015-06-18

## 2015-06-15 MED ORDER — OXYCODONE-ACETAMINOPHEN 5 MG-325 MG TAB
5-325 mg | ORAL_TABLET | ORAL | 0 refills | Status: AC | PRN
Start: 2015-06-15 — End: ?

## 2015-06-15 NOTE — Procedures (Signed)
Vasectomy Procedure:    Bilateral scalpel less vasectomy was performed using aseptic technique and local anesthetic. The Vas was dissected bilaterally and clamped with hemaclips. There was good hemostasis. A segment of the Vas was excised on each side. No sutures were placed on the skin. The puncture sites were covered with gauze and the patient put his underwear back on. Post-procedure instructions were given on a sheet of paper. Several days of antibiotics were given along with a few narcotic pain pills. The patient was instructed to use ibuprofen 600 mg PO three times a day for five days. The patient will follow up in approximately one week to check his progress.    Of note was that he had a previous orchiopexy on the right side the spermatic cord and vas on the right was thickened and shortened but it seems like identified the vasa correctly and clipped and divided it.    Orders Placed This Encounter   ??? ciprofloxacin HCl (CIPRO) 500 mg tablet     Sig: Take 1 Tab by mouth two (2) times a day for 3 days.     Dispense:  6 Tab     Refill:  0   ??? oxyCODONE-acetaminophen (PERCOCET) 5-325 mg per tablet     Sig: Take 1 Tab by mouth every four (4) hours as needed for Pain. Max Daily Amount: 6 Tabs.     Dispense:  10 Tab     Refill:  0     Follow-up Disposition:  Return if symptoms worsen or fail to improve, for Continue birth control until we can check a semen specimen and approximately 8 weeks.Marland Kitchen

## 2015-06-15 NOTE — Progress Notes (Signed)
See procedure note/dh

## 2015-08-10 ENCOUNTER — Encounter: Attending: Urology | Primary: Internal Medicine

## 2015-08-10 ENCOUNTER — Other Ambulatory Visit: Payer: BLUE CROSS/BLUE SHIELD | Attending: Urology | Primary: Internal Medicine

## 2015-08-10 NOTE — Progress Notes (Signed)
Pt brought in his 1 st semen specimen today.    Patient semen check has no modal sperm patient may discontinue his birth control.    CB# [phone]
